# Patient Record
Sex: Male | Born: 1970 | Race: White | Marital: Married | Smoking: Former smoker
Health system: Southern US, Community
[De-identification: ages and names within clinical notes are randomized; demographics above are authoritative.]

---

## 2016-02-20 ENCOUNTER — Emergency Department
Admission: EM | Admit: 2016-02-20 | Discharge: 2016-02-20 | Disposition: A | Discharge status: Home / Self Care | Payer: 59 | Attending: Emergency Medicine | Admitting: Emergency Medicine

## 2016-02-20 ENCOUNTER — Emergency Department: Payer: 59

## 2016-02-20 ENCOUNTER — Encounter: Payer: Self-pay | Admitting: Emergency Medicine

## 2016-02-20 DIAGNOSIS — Y939 Activity, unspecified: Secondary | ICD-10-CM | POA: Diagnosis not present

## 2016-02-20 DIAGNOSIS — Z1810 Retained metal fragments, unspecified: Secondary | ICD-10-CM | POA: Insufficient documentation

## 2016-02-20 DIAGNOSIS — S61341A Puncture wound with foreign body of left index finger with damage to nail, initial encounter: Secondary | ICD-10-CM | POA: Diagnosis not present

## 2016-02-20 DIAGNOSIS — S60552A Superficial foreign body of left hand, initial encounter: Secondary | ICD-10-CM

## 2016-02-20 DIAGNOSIS — Y990 Civilian activity done for income or pay: Secondary | ICD-10-CM | POA: Insufficient documentation

## 2016-02-20 DIAGNOSIS — Y929 Unspecified place or not applicable: Secondary | ICD-10-CM | POA: Insufficient documentation

## 2016-02-20 DIAGNOSIS — Z87891 Personal history of nicotine dependence: Secondary | ICD-10-CM | POA: Insufficient documentation

## 2016-02-20 DIAGNOSIS — W294XXA Contact with nail gun, initial encounter: Secondary | ICD-10-CM | POA: Diagnosis not present

## 2016-02-20 MED ORDER — CLINDAMYCIN HCL 150 MG PO CAPS: ORAL_CAPSULE | ORAL | Status: AC

## 2016-02-20 MED ORDER — CLINDAMYCIN HCL 150 MG PO CAPS
300.0000 mg | ORAL_CAPSULE | Freq: Once | ORAL | Status: AC
  Administered 2016-02-20: 300 mg via ORAL
  Filled 2016-02-20: qty 2

## 2016-02-20 MED ORDER — LIDOCAINE HCL (PF) 1 % IJ SOLN
5.0000 mL | Freq: Once | INTRAMUSCULAR | Status: AC
  Administered 2016-02-20: 5 mL
  Filled 2016-02-20: qty 5

## 2016-02-20 MED ORDER — HYDROCODONE-ACETAMINOPHEN 5-325 MG PO TABS: 1.0000 | ORAL_TABLET | ORAL | Status: AC | PRN
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
## 2016-02-20 NOTE — Discharge Instructions (Signed)
Call and make an appointment with Dr. Hyacinth MeekerMiller for follow-up of your hand injury. Clindamycin as directed for infection prevention. Norco as needed for pain. Soak your hand in mild soapy water twice a day. Wear a dressing at work and keep area clean and dry. Follow-up immediately if there are any signs of infection such as redness, pus, fever.

## 2016-02-20 NOTE — ED Notes (Signed)
E sig pad not working, pt verbalized understanding 
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
## 2016-03-06 NOTE — ED Provider Notes (Signed)
Medical screening examination/treatment/procedure(s) were performed by non-physician practitioner and as supervising physician I was immediately available for consultation/collaboration.    Emily FilbertJonathan E Williams, MD 03/06/16 [phone]
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
## 2021-12-13 DIAGNOSIS — Z0000 Encounter for general adult medical examination without abnormal findings: Secondary | ICD-10-CM | POA: Diagnosis not present

## 2021-12-13 DIAGNOSIS — Z23 Encounter for immunization: Secondary | ICD-10-CM | POA: Diagnosis not present

## 2021-12-13 DIAGNOSIS — Z125 Encounter for screening for malignant neoplasm of prostate: Secondary | ICD-10-CM | POA: Diagnosis not present

## 2021-12-13 DIAGNOSIS — R5383 Other fatigue: Secondary | ICD-10-CM | POA: Diagnosis not present

## 2021-12-13 DIAGNOSIS — E559 Vitamin D deficiency, unspecified: Secondary | ICD-10-CM | POA: Diagnosis not present

## 2021-12-13 DIAGNOSIS — E7800 Pure hypercholesterolemia, unspecified: Secondary | ICD-10-CM | POA: Diagnosis not present

## 2021-12-14 DIAGNOSIS — M65872 Other synovitis and tenosynovitis, left ankle and foot: Secondary | ICD-10-CM | POA: Diagnosis not present

## 2021-12-14 DIAGNOSIS — M25572 Pain in left ankle and joints of left foot: Secondary | ICD-10-CM | POA: Diagnosis not present

## 2022-05-13 DIAGNOSIS — L0292 Furuncle, unspecified: Secondary | ICD-10-CM | POA: Diagnosis not present

## 2022-09-19 DIAGNOSIS — R7309 Other abnormal glucose: Secondary | ICD-10-CM | POA: Diagnosis not present

## 2022-10-20 DIAGNOSIS — R7309 Other abnormal glucose: Secondary | ICD-10-CM | POA: Diagnosis not present

## 2022-11-18 DIAGNOSIS — R7309 Other abnormal glucose: Secondary | ICD-10-CM | POA: Diagnosis not present

## 2022-12-19 DIAGNOSIS — R7309 Other abnormal glucose: Secondary | ICD-10-CM | POA: Diagnosis not present

## 2023-01-18 DIAGNOSIS — R7309 Other abnormal glucose: Secondary | ICD-10-CM | POA: Diagnosis not present

## 2023-02-18 DIAGNOSIS — R7309 Other abnormal glucose: Secondary | ICD-10-CM | POA: Diagnosis not present

## 2023-03-20 DIAGNOSIS — R7309 Other abnormal glucose: Secondary | ICD-10-CM | POA: Diagnosis not present

## 2023-04-20 DIAGNOSIS — R7309 Other abnormal glucose: Secondary | ICD-10-CM | POA: Diagnosis not present

## 2023-05-21 DIAGNOSIS — R7309 Other abnormal glucose: Secondary | ICD-10-CM | POA: Diagnosis not present

## 2023-06-20 DIAGNOSIS — R7309 Other abnormal glucose: Secondary | ICD-10-CM | POA: Diagnosis not present
